# Patient Record
Sex: Female | Born: 1960 | Race: White | Hispanic: No | Marital: Married | Smoking: Current every day smoker
Health system: Southern US, Community
[De-identification: ages and names within clinical notes are randomized; demographics above are authoritative.]

## PROBLEM LIST (undated history)

## (undated) DIAGNOSIS — J449 Chronic obstructive pulmonary disease, unspecified: Secondary | ICD-10-CM

## (undated) DIAGNOSIS — E079 Disorder of thyroid, unspecified: Secondary | ICD-10-CM

## (undated) DIAGNOSIS — I10 Essential (primary) hypertension: Secondary | ICD-10-CM

## (undated) DIAGNOSIS — R000 Tachycardia, unspecified: Secondary | ICD-10-CM

## (undated) HISTORY — DX: Chronic obstructive pulmonary disease, unspecified: J44.9

## (undated) HISTORY — DX: Disorder of thyroid, unspecified: E07.9

## (undated) HISTORY — DX: Tachycardia, unspecified: R00.0

---

## 2014-10-05 HISTORY — PX: LOBECTOMY: SHX5089

## 2019-05-18 ENCOUNTER — Ambulatory Visit: Payer: BLUE CROSS/BLUE SHIELD | Attending: Internal Medicine

## 2019-05-18 DIAGNOSIS — Z20822 Contact with and (suspected) exposure to covid-19: Secondary | ICD-10-CM

## 2019-05-20 LAB — NOVEL CORONAVIRUS, NAA: SARS-CoV-2, NAA: NOT DETECTED

## 2019-05-21 ENCOUNTER — Ambulatory Visit: Payer: BLUE CROSS/BLUE SHIELD | Attending: Internal Medicine

## 2019-05-21 DIAGNOSIS — Z20822 Contact with and (suspected) exposure to covid-19: Secondary | ICD-10-CM

## 2019-05-22 LAB — NOVEL CORONAVIRUS, NAA: SARS-CoV-2, NAA: NOT DETECTED

## 2019-09-30 ENCOUNTER — Other Ambulatory Visit: Payer: Self-pay | Admitting: Pulmonary Disease

## 2019-09-30 DIAGNOSIS — C342 Malignant neoplasm of middle lobe, bronchus or lung: Secondary | ICD-10-CM

## 2019-09-30 DIAGNOSIS — C3481 Malignant neoplasm of overlapping sites of right bronchus and lung: Secondary | ICD-10-CM

## 2019-10-12 ENCOUNTER — Ambulatory Visit
Admission: RE | Admit: 2019-10-12 | Discharge: 2019-10-12 | Disposition: A | Discharge status: Home / Self Care | Payer: BLUE CROSS/BLUE SHIELD | Source: Ambulatory Visit | Attending: Pulmonary Disease | Admitting: Pulmonary Disease

## 2019-10-12 ENCOUNTER — Other Ambulatory Visit: Payer: Self-pay

## 2019-10-12 DIAGNOSIS — C3481 Malignant neoplasm of overlapping sites of right bronchus and lung: Secondary | ICD-10-CM | POA: Insufficient documentation

## 2019-10-12 DIAGNOSIS — C342 Malignant neoplasm of middle lobe, bronchus or lung: Secondary | ICD-10-CM | POA: Insufficient documentation

## 2019-10-12 HISTORY — DX: Essential (primary) hypertension: I10

## 2019-10-12 LAB — POCT I-STAT CREATININE: Creatinine, Ser: 0.5 mg/dL (ref 0.44–1.00)

## 2019-10-12 MED ORDER — IOHEXOL 300 MG/ML  SOLN
75.0000 mL | Freq: Once | INTRAMUSCULAR | Status: AC | PRN
  Administered 2019-10-12: 75 mL via INTRAVENOUS

## 2020-03-09 ENCOUNTER — Other Ambulatory Visit: Payer: Self-pay | Admitting: Internal Medicine

## 2020-03-09 DIAGNOSIS — Z1231 Encounter for screening mammogram for malignant neoplasm of breast: Secondary | ICD-10-CM

## 2020-08-04 ENCOUNTER — Inpatient Hospital Stay: Payer: BLUE CROSS/BLUE SHIELD

## 2020-08-04 ENCOUNTER — Inpatient Hospital Stay: Payer: BLUE CROSS/BLUE SHIELD | Attending: Oncology | Admitting: Oncology

## 2020-08-04 ENCOUNTER — Encounter: Payer: Self-pay | Admitting: Oncology

## 2020-08-04 VITALS — BP 124/84 | HR 84 | Temp 99.7°F | Resp 18 | Ht 59.0 in | Wt 124.7 lb

## 2020-08-04 DIAGNOSIS — D751 Secondary polycythemia: Secondary | ICD-10-CM

## 2020-08-04 DIAGNOSIS — D72829 Elevated white blood cell count, unspecified: Secondary | ICD-10-CM | POA: Diagnosis not present

## 2020-08-04 DIAGNOSIS — F17210 Nicotine dependence, cigarettes, uncomplicated: Secondary | ICD-10-CM | POA: Diagnosis not present

## 2020-08-04 DIAGNOSIS — Z800 Family history of malignant neoplasm of digestive organs: Secondary | ICD-10-CM | POA: Insufficient documentation

## 2020-08-04 DIAGNOSIS — Z807 Family history of other malignant neoplasms of lymphoid, hematopoietic and related tissues: Secondary | ICD-10-CM | POA: Diagnosis not present

## 2020-08-04 DIAGNOSIS — Z902 Acquired absence of lung [part of]: Secondary | ICD-10-CM | POA: Diagnosis not present

## 2020-08-04 DIAGNOSIS — J439 Emphysema, unspecified: Secondary | ICD-10-CM | POA: Diagnosis not present

## 2020-08-04 DIAGNOSIS — Z85118 Personal history of other malignant neoplasm of bronchus and lung: Secondary | ICD-10-CM | POA: Diagnosis not present

## 2020-08-04 LAB — COMPREHENSIVE METABOLIC PANEL
ALT: 27 U/L (ref 0–44)
AST: 28 U/L (ref 15–41)
Albumin: 4.2 g/dL (ref 3.5–5.0)
Alkaline Phosphatase: 68 U/L (ref 38–126)
Anion gap: 10 (ref 5–15)
BUN: 13 mg/dL (ref 6–20)
CO2: 27 mmol/L (ref 22–32)
Calcium: 9.4 mg/dL (ref 8.9–10.3)
Chloride: 102 mmol/L (ref 98–111)
Creatinine, Ser: 0.48 mg/dL (ref 0.44–1.00)
GFR, Estimated: >60 mL/min (ref >60)
Glucose, Bld: 99 mg/dL (ref 70–99)
Potassium: 3.8 mmol/L (ref 3.5–5.1)
Sodium: 139 mmol/L (ref 135–145)
Total Bilirubin: 0.6 mg/dL (ref 0.3–1.2)
Total Protein: 7.4 g/dL (ref 6.5–8.1)

## 2020-08-04 LAB — CBC WITH DIFFERENTIAL/PLATELET
Abs Immature Granulocytes: 0.04 10*3/uL (ref 0.00–0.07)
Basophils Absolute: 0.1 10*3/uL (ref 0.0–0.1)
Basophils Relative: 1 %
Eosinophils Absolute: 0.2 10*3/uL (ref 0.0–0.5)
Eosinophils Relative: 2 %
HCT: 43.7 % (ref 36.0–46.0)
Hemoglobin: 14.9 g/dL (ref 12.0–15.0)
Immature Granulocytes: 0 %
Lymphocytes Relative: 25 %
Lymphs Abs: 2.8 10*3/uL (ref 0.7–4.0)
MCH: 29.3 pg (ref 26.0–34.0)
MCHC: 34.1 g/dL (ref 30.0–36.0)
MCV: 85.9 fL (ref 80.0–100.0)
Monocytes Absolute: 1.1 10*3/uL — ABNORMAL HIGH (ref 0.1–1.0)
Monocytes Relative: 10 %
Neutro Abs: 6.9 10*3/uL (ref 1.7–7.7)
Neutrophils Relative %: 62 %
Platelets: 281 10*3/uL (ref 150–400)
RBC: 5.09 MIL/uL (ref 3.87–5.11)
RDW: 12.5 % (ref 11.5–15.5)
WBC: 11 10*3/uL — ABNORMAL HIGH (ref 4.0–10.5)
nRBC: 0 % (ref 0.0–0.2)

## 2020-08-04 LAB — LACTATE DEHYDROGENASE: LDH: 120 U/L (ref 98–192)

## 2020-08-04 NOTE — Progress Notes (Signed)
Patient here to establish care  

## 2020-08-04 NOTE — Progress Notes (Signed)
Hematology/Oncology Consult note [facility] Telephone:([phone] Fax:(336) [phone]   Patient Care Team: Gladstone Lighter, MD as PCP - General (Internal Medicine)  REFERRING PROVIDER: Ottie Glazier, MD  CHIEF COMPLAINTS/REASON FOR VISIT:  Evaluation of history of lung cancer  HISTORY OF PRESENTING ILLNESS:   Jamie Harmon is a  60 y.o.  female with PMH listed below was seen in consultation at the request of  Ottie Glazier, MD  for evaluation of history of lung cancer. Patient moved from [place] to [place] to stay closer to her son and grandkids living alone. Patient is current everyday smoker, 10 cigarettes/day.  She used to smoke 2 packs a day for at least 40 years. She had a history of Lung cancer status post right upper lobectomy.  She denies receiving any chemotherapy or radiation for the lung cancer. She has chronic cough with clear phlegm. She has been having surveillance CT done annually which was interrupted due to pandemic.  Family history she reports brother was diagnosed with esophageal cancer, another brother with GI cancer, mother with a history of gastric cancer.  Father has lymphoma.  Patient had surveillance CT scan done on 10/12/2019, CT showed status post right upper lobectomy-2016 no evidence of recurrent or metastatic disease in the chest. Patient has innumerable tiny centrilobular pulmonary nodules, nonspecific likely due to smoking-related respiratory bronchiolitis.  Emphysema.  Coronary artery disease.  Aortic atherosclerosis.  Hepatic steatosis.  Patient reports that recently she has had experienced more shortness of breath.  Denies any unintentional weight loss, fever, night sweats.Denies any hemoptysis.  Review of Systems  Constitutional: Negative for appetite change, chills, fatigue and fever.  HENT:   Negative for hearing loss and voice change.   Eyes: Negative for eye problems.  Respiratory: Positive for  cough and shortness of breath. Negative for chest tightness.   Cardiovascular: Negative for chest pain.  Gastrointestinal: Negative for abdominal distention, abdominal pain and blood in stool.  Endocrine: Negative for hot flashes.  Genitourinary: Negative for difficulty urinating and frequency.   Musculoskeletal: Negative for arthralgias.  Skin: Negative for itching and rash.  Neurological: Negative for extremity weakness.  Hematological: Negative for adenopathy.  Psychiatric/Behavioral: Negative for confusion.    MEDICAL HISTORY:  Past Medical History:  Diagnosis Date  . COPD (chronic obstructive pulmonary disease) ([place])   . Hypertension   . Tachycardia   . Thyroid disease     SURGICAL HISTORY: Past Surgical History:  Procedure Laterality Date  . LOBECTOMY  10/2014    SOCIAL HISTORY: Social History   Socioeconomic History  . Marital status: Married    Spouse name: Not on file  . Number of children: Not on file  . Years of education: Not on file  . Highest education level: Not on file  Occupational History  . Occupation: not working   Tobacco Use  . Smoking status: Current Every Day Smoker    Packs/day: 0.50    Years: 45.00    Pack years: 22.50    Types: Cigarettes  . Smokeless tobacco: Never Used  Vaping Use  . Vaping Use: Never used  Substance and Sexual Activity  . Alcohol use: Yes    Comment: occasional wine   . Drug use: Never  . Sexual activity: Not on file  Other Topics Concern  . Not on file  Social History Narrative  . Not on file   Social Determinants of Health   Financial Resource Strain: Not on file  Food Insecurity: Not on file  Transportation Needs: Not on file  Physical Activity: Not on file  Stress: Not on file  Social Connections: Not on file  Intimate Partner Violence: Not on file    FAMILY HISTORY: Family History  Problem Relation Age of Onset  . Stomach cancer Mother   . Hypertension Father   . Lymphoma Father   . Diabetes  Sister   . Blindness Sister   . Hypertension Sister   . Colon cancer Brother   . Esophageal cancer Brother   . Hypertension Brother   . Diabetes Brother     ALLERGIES:  has No Known Allergies.  MEDICATIONS:  Current Outpatient Medications  Medication Sig Dispense Refill  . aspirin 81 MG chewable tablet Chew 81 mg by mouth daily.    Marland Kitchen BREZTRI AEROSPHERE 160-9-4.8 MCG/ACT AERO Inhale 2 puffs into the lungs 2 (two) times daily.    . cetirizine (ZYRTEC) 10 MG tablet Take 10 mg by mouth at bedtime.    . chlorthalidone (HYGROTON) 25 MG tablet Take 25 mg by mouth daily.    . fluticasone (FLONASE) 50 MCG/ACT nasal spray Place 1 spray into both nostrils 2 (two) times daily as needed.    Marland Kitchen levothyroxine (SYNTHROID) 200 MCG tablet Take 200 mcg by mouth daily.    . potassium chloride (KLOR-CON) 10 MEQ tablet Take 1 tablet by mouth daily.    . propranolol (INDERAL) 40 MG tablet Take 40 mg by mouth at bedtime.    . rosuvastatin (CRESTOR) 40 MG tablet Take 40 mg by mouth daily.     No current facility-administered medications for this visit.     PHYSICAL EXAMINATION: ECOG PERFORMANCE STATUS: 1 - Symptomatic but completely ambulatory Vitals:   08/04/20 1106  BP: 124/84  Pulse: 84  Resp: 18  Temp: 99.7 F (37.6 C)  SpO2: (!) 6%   Filed Weights   08/04/20 1106  Weight: 124 lb 11.2 oz (56.6 kg)    Physical Exam Constitutional:      General: She is not in acute distress. HENT:     Head: Normocephalic and atraumatic.  Eyes:     General: No scleral icterus. Cardiovascular:     Rate and Rhythm: Normal rate and regular rhythm.     Heart sounds: Normal heart sounds.  Pulmonary:     Effort: Pulmonary effort is normal. No respiratory distress.     Breath sounds: No wheezing.     Comments: Decreased breath sounds bilaterally Abdominal:     General: Bowel sounds are normal. There is no distension.     Palpations: Abdomen is soft.  Musculoskeletal:        General: No deformity. Normal  range of motion.     Cervical back: Normal range of motion and neck supple.  Skin:    General: Skin is warm and dry.     Findings: No erythema or rash.  Neurological:     Mental Status: She is alert and oriented to person, place, and time. Mental status is at baseline.     Cranial Nerves: No cranial nerve deficit.     Coordination: Coordination normal.  Psychiatric:        Mood and Affect: Mood normal.     LABORATORY DATA:  I have reviewed the data as listed Lab Results  Component Value Date   WBC 11.0 (H) 08/04/2020   HGB 14.9 08/04/2020   HCT 43.7 08/04/2020   MCV 85.9 08/04/2020   PLT 281 08/04/2020   Recent Labs    10/12/19 0900  08/04/20 1139  NA  --  139  K  --  3.8  CL  --  102  CO2  --  27  GLUCOSE  --  99  BUN  --  13  CREATININE 0.50 0.48  CALCIUM  --  9.4  GFRNONAA  --  >60  PROT  --  7.4  ALBUMIN  --  4.2  AST  --  28  ALT  --  27  ALKPHOS  --  68  BILITOT  --  0.6   Iron/TIBC/Ferritin/ %Sat No results found for: IRON, TIBC, FERRITIN, IRONPCTSAT    RADIOGRAPHIC STUDIES: I have personally reviewed the radiological images as listed and agreed with the findings in the report. No results found.    ASSESSMENT & PLAN:  1. History of lung cancer   2. Leukocytosis, unspecified type    #History of lung cancer status post right upper lobectomy. Records were not available to me at this point.  Her history indicates likely early stage lung cancer. Patient has been 5 years out of her lung cancer diagnosis/treatment.  #Tobacco abuse, smoking cessation was discussed.  Given her significant smoking history, history of lung cancer, I recommend annual surveillance CT scan.  Will obtain in June 2022. #COPD/emphysema.  Follow-up with pulmonology.  # leukocytosis check CBC CMP today.  Likely due to smoking.  Monitor.  Orders Placed This Encounter  Procedures  . CT Chest W Contrast    Standing Status:   Future    Standing Expiration Date:   08/04/2021     Order Specific Question:   If indicated for the ordered procedure, I authorize the administration of contrast media per Radiology protocol    Answer:   Yes    Order Specific Question:   Is patient pregnant?    Answer:   No    Order Specific Question:   Preferred imaging location?    Answer:   [facility]  . CBC with Differential/Platelet    Standing Status:   Future    Number of Occurrences:   1    Standing Expiration Date:   08/04/2021  . Comprehensive metabolic panel    Standing Status:   Future    Number of Occurrences:   1    Standing Expiration Date:   08/04/2021  . Carbon monoxide, blood (performed at ref lab)    Standing Status:   Future    Number of Occurrences:   1    Standing Expiration Date:   08/04/2021  . Lactate dehydrogenase    Standing Status:   Future    Number of Occurrences:   1    Standing Expiration Date:   08/04/2021    All questions were answered. The patient knows to call the clinic with any problems questions or concerns.  cc Ottie Glazier, MD    Return of visit: After June 2022 CT scan. Thank you for this kind referral and the opportunity to participate in the care of this patient. A copy of today's note is routed to referring provider    Earlie Server, MD, PhD Hematology Oncology [facility] Pager- [phone] 08/04/2020

## 2020-08-07 LAB — CARBON MONOXIDE, BLOOD (PERFORMED AT REF LAB): Carbon Monoxide, Blood: 6.6 % — ABNORMAL HIGH (ref 0.0–3.6)

## 2020-10-12 ENCOUNTER — Other Ambulatory Visit: Payer: Self-pay

## 2020-10-12 ENCOUNTER — Ambulatory Visit
Admission: RE | Admit: 2020-10-12 | Discharge: 2020-10-12 | Disposition: A | Discharge status: Home / Self Care | Payer: BLUE CROSS/BLUE SHIELD | Source: Ambulatory Visit | Attending: Oncology | Admitting: Oncology

## 2020-10-12 DIAGNOSIS — Z85118 Personal history of other malignant neoplasm of bronchus and lung: Secondary | ICD-10-CM

## 2020-10-12 LAB — POCT I-STAT CREATININE: Creatinine, Ser: 0.5 mg/dL (ref 0.44–1.00)

## 2020-10-12 MED ORDER — IOHEXOL 300 MG/ML  SOLN
75.0000 mL | Freq: Once | INTRAMUSCULAR | Status: AC | PRN
  Administered 2020-10-12: 75 mL via INTRAVENOUS

## 2020-10-17 ENCOUNTER — Inpatient Hospital Stay: Payer: BLUE CROSS/BLUE SHIELD | Attending: Oncology | Admitting: Oncology

## 2020-10-17 ENCOUNTER — Other Ambulatory Visit: Payer: Self-pay

## 2020-10-17 ENCOUNTER — Encounter: Payer: Self-pay | Admitting: Oncology

## 2020-10-17 VITALS — BP 109/79 | HR 78 | Temp 97.9°F | Resp 16 | Wt 124.5 lb

## 2020-10-17 DIAGNOSIS — Z7982 Long term (current) use of aspirin: Secondary | ICD-10-CM | POA: Insufficient documentation

## 2020-10-17 DIAGNOSIS — E079 Disorder of thyroid, unspecified: Secondary | ICD-10-CM | POA: Diagnosis not present

## 2020-10-17 DIAGNOSIS — D72829 Elevated white blood cell count, unspecified: Secondary | ICD-10-CM | POA: Insufficient documentation

## 2020-10-17 DIAGNOSIS — Z720 Tobacco use: Secondary | ICD-10-CM | POA: Diagnosis not present

## 2020-10-17 DIAGNOSIS — Z902 Acquired absence of lung [part of]: Secondary | ICD-10-CM | POA: Diagnosis not present

## 2020-10-17 DIAGNOSIS — Z79899 Other long term (current) drug therapy: Secondary | ICD-10-CM | POA: Diagnosis not present

## 2020-10-17 DIAGNOSIS — Z85118 Personal history of other malignant neoplasm of bronchus and lung: Secondary | ICD-10-CM

## 2020-10-17 DIAGNOSIS — I10 Essential (primary) hypertension: Secondary | ICD-10-CM | POA: Insufficient documentation

## 2020-10-17 DIAGNOSIS — F17210 Nicotine dependence, cigarettes, uncomplicated: Secondary | ICD-10-CM | POA: Diagnosis not present

## 2020-10-17 NOTE — Progress Notes (Signed)
Hematology/Oncology Consult note [facility] Telephone:([phone] Fax:(336) [phone]   Patient Care Team: Gladstone Lighter, MD as PCP - General (Internal Medicine)  REFERRING PROVIDER: Gladstone Lighter, MD  CHIEF COMPLAINTS/REASON FOR VISIT:  Evaluation of history of lung cancer  HISTORY OF PRESENTING ILLNESS:   Jamie Harmon is a  60 y.o.  female with PMH listed below was seen in consultation at the request of  Gladstone Lighter, MD  for evaluation of history of lung cancer. Patient moved from [place] to [place] to stay closer to her son and grandkids living alone. Patient is current everyday smoker, 10 cigarettes/day.  She used to smoke 2 packs a day for at least 40 years. She had a history of Lung cancer status post right upper lobectomy.  She denies receiving any chemotherapy or radiation for the lung cancer. She has chronic cough with clear phlegm. She has been having surveillance CT done annually which was interrupted due to pandemic.  Family history she reports brother was diagnosed with esophageal cancer, another brother with GI cancer, mother with a history of gastric cancer.  Father has lymphoma.  Patient had surveillance CT scan done on 10/12/2019, CT showed status post right upper lobectomy-2016 no evidence of recurrent or metastatic disease in the chest. Patient has innumerable tiny centrilobular pulmonary nodules, nonspecific likely due to smoking-related respiratory bronchiolitis.  Emphysema.  Coronary artery disease.  Aortic atherosclerosis.  Hepatic steatosis.  Patient reports that recently she has had experienced more shortness of breath.  Denies any unintentional weight loss, fever, night sweats.Denies any hemoptysis.   INTERVAL HISTORY Jamie Harmon is a 60 y.o. female who has above history reviewed by me today presents for follow up visit for management of history of lung cancer Problems and complaints  are listed below: No new complaints.  She had a CT done since last visit.  Review of Systems  Constitutional:  Negative for appetite change, chills, fatigue and fever.  HENT:   Negative for hearing loss and voice change.   Eyes:  Negative for eye problems.  Respiratory:  Positive for cough and shortness of breath. Negative for chest tightness.   Cardiovascular:  Negative for chest pain.  Gastrointestinal:  Negative for abdominal distention, abdominal pain and blood in stool.  Endocrine: Negative for hot flashes.  Genitourinary:  Negative for difficulty urinating and frequency.   Musculoskeletal:  Negative for arthralgias.  Skin:  Negative for itching and rash.  Neurological:  Negative for extremity weakness.  Hematological:  Negative for adenopathy.  Psychiatric/Behavioral:  Negative for confusion.    MEDICAL HISTORY:  Past Medical History:  Diagnosis Date   COPD (chronic obstructive pulmonary disease) ([place])    Hypertension    Tachycardia    Thyroid disease     SURGICAL HISTORY: Past Surgical History:  Procedure Laterality Date   LOBECTOMY  10/2014    SOCIAL HISTORY: Social History   Socioeconomic History   Marital status: Married    Spouse name: Not on file   Number of children: Not on file   Years of education: Not on file   Highest education level: Not on file  Occupational History   Occupation: not working   Tobacco Use   Smoking status: Every Day    Packs/day: 0.50    Years: 45.00    Pack years: 22.50    Types: Cigarettes   Smokeless tobacco: Never  Vaping Use   Vaping Use: Never used  Substance and Sexual Activity   Alcohol use:  Yes    Comment: occasional wine    Drug use: Never   Sexual activity: Not on file  Other Topics Concern   Not on file  Social History Narrative   Not on file   Social Determinants of Health   Financial Resource Strain: Not on file  Food Insecurity: Not on file  Transportation Needs: Not on file  Physical Activity: Not  on file  Stress: Not on file  Social Connections: Not on file  Intimate Partner Violence: Not on file    FAMILY HISTORY: Family History  Problem Relation Age of Onset   Stomach cancer Mother    Hypertension Father    Lymphoma Father    Diabetes Sister    Blindness Sister    Hypertension Sister    Colon cancer Brother    Esophageal cancer Brother    Hypertension Brother    Diabetes Brother     ALLERGIES:  has No Known Allergies.  MEDICATIONS:  Current Outpatient Medications  Medication Sig Dispense Refill   aspirin 81 MG chewable tablet Chew 81 mg by mouth daily.     BREZTRI AEROSPHERE 160-9-4.8 MCG/ACT AERO Inhale 2 puffs into the lungs 2 (two) times daily.     cetirizine (ZYRTEC) 10 MG tablet Take 10 mg by mouth at bedtime.     chlorthalidone (HYGROTON) 25 MG tablet Take 25 mg by mouth daily.     fluticasone (FLONASE) 50 MCG/ACT nasal spray Place 1 spray into both nostrils 2 (two) times daily as needed.     levothyroxine (SYNTHROID) 200 MCG tablet Take 200 mcg by mouth daily.     potassium chloride (KLOR-CON) 10 MEQ tablet Take 1 tablet by mouth daily.     propranolol (INDERAL) 40 MG tablet Take 40 mg by mouth at bedtime.     rosuvastatin (CRESTOR) 40 MG tablet Take 40 mg by mouth daily.     No current facility-administered medications for this visit.     PHYSICAL EXAMINATION: ECOG PERFORMANCE STATUS: 1 - Symptomatic but completely ambulatory Vitals:   10/17/20 1002  BP: 109/79  Pulse: 78  Resp: 16  Temp: 97.9 F (36.6 C)   Filed Weights   10/17/20 1002  Weight: 124 lb 8 oz (56.5 kg)    Physical Exam Constitutional:      General: She is not in acute distress. HENT:     Head: Normocephalic and atraumatic.  Eyes:     General: No scleral icterus. Cardiovascular:     Rate and Rhythm: Normal rate and regular rhythm.     Heart sounds: Normal heart sounds.  Pulmonary:     Effort: Pulmonary effort is normal. No respiratory distress.     Breath sounds: No  wheezing.     Comments: Decreased breath sounds bilaterally Abdominal:     General: Bowel sounds are normal. There is no distension.     Palpations: Abdomen is soft.  Musculoskeletal:        General: No deformity. Normal range of motion.     Cervical back: Normal range of motion and neck supple.  Skin:    General: Skin is warm and dry.     Findings: No erythema or rash.  Neurological:     Mental Status: She is alert and oriented to person, place, and time. Mental status is at baseline.     Cranial Nerves: No cranial nerve deficit.     Coordination: Coordination normal.  Psychiatric:        Mood and Affect: Mood normal.  LABORATORY DATA:  I have reviewed the data as listed Lab Results  Component Value Date   WBC 11.0 (H) 08/04/2020   HGB 14.9 08/04/2020   HCT 43.7 08/04/2020   MCV 85.9 08/04/2020   PLT 281 08/04/2020   Recent Labs    08/04/20 1139 10/12/20 1048  NA 139  --   K 3.8  --   CL 102  --   CO2 27  --   GLUCOSE 99  --   BUN 13  --   CREATININE 0.48 0.50  CALCIUM 9.4  --   GFRNONAA >60  --   PROT 7.4  --   ALBUMIN 4.2  --   AST 28  --   ALT 27  --   ALKPHOS 68  --   BILITOT 0.6  --     Iron/TIBC/Ferritin/ %Sat No results found for: IRON, TIBC, FERRITIN, IRONPCTSAT    RADIOGRAPHIC STUDIES: I have personally reviewed the radiological images as listed and agreed with the findings in the report. CT Chest W Contrast  Result Date: 10/13/2020 CLINICAL DATA:  History of lung cancer status post right upper lobectomy in 2016. Chronic shortness of breath. Surveillance. EXAM: CT CHEST WITH CONTRAST TECHNIQUE: Multidetector CT imaging of the chest was performed during intravenous contrast administration. CONTRAST:  83mL OMNIPAQUE IOHEXOL 300 MG/ML  SOLN COMPARISON:  CT chest 10/12/2019. FINDINGS: Cardiovascular: No acute vascular findings. There is atherosclerosis of the aorta, great vessels and coronary arteries. There is dense calcification at the origin of the  right subclavian artery which may indicate significant stenosis. The heart size is normal. There is no pericardial effusion. Mediastinum/Nodes: There are no enlarged mediastinal, hilar or axillary lymph nodes.Small mediastinal lymph nodes are unchanged. The thyroid gland appears small without focal abnormality. The trachea and esophagus appear unremarkable. Lungs/Pleura: There is no pleural effusion or pneumothorax. Status post right upper lobectomy. Mild centrilobular emphysema with linear right basilar scarring. No suspicious pulmonary nodularity. Previously questioned findings of smoking related respiratory bronchiolitis appear improved. Upper abdomen: The visualized upper abdomen appears stable without significant findings. Musculoskeletal/Chest wall: There is no chest wall mass or suspicious osseous finding. Mild thoracic spondylosis. IMPRESSION: 1. Stable postoperative chest CT status post right upper lobe resection. No evidence of local recurrence or metastatic disease. 2. Dense calcification at the origin of the right subclavian artery may indicate significant stenosis. Correlate clinically. 3. Aortic Atherosclerosis (ICD10-I70.0) and Emphysema (ICD10-J43.9). Electronically Signed   By: Richardean Sale M.D.   On: 10/13/2020 09:07      ASSESSMENT & PLAN:  1. History of lung cancer   2. Leukocytosis, unspecified type   3. Tobacco use    #History of lung cancer status post right upper lobectomy. Records were not available to me at this point.  Her history indicates likely early stage lung cancer. Chronic cough likely secondary to tobacco use.  Smoking cessation was discussed. 10/12/2020, surveillance CT scan showed stable postoperative chest CT findings.  No evidence of local recurrence or metastatic disease.  #COPD/emphysema.  Follow-up with pulmonology. #Dense calcification at the origin of right subclavian artery which may indicate significant stenosis.  Recommend patient to further discuss with  primary care doctor and cardiology.  Continue aspirin and statin. # leukocytosis check CBC CMP today.  Likely due to smoking.  Recommend observation.  Orders Placed This Encounter  Procedures   CT Chest W Contrast    Standing Status:   Future    Standing Expiration Date:   10/17/2021  Order Specific Question:   If indicated for the ordered procedure, I authorize the administration of contrast media per Radiology protocol    Answer:   Yes    Order Specific Question:   Is patient pregnant?    Answer:   No    Order Specific Question:   Preferred imaging location?    Answer:   [facility]   CBC with Differential/Platelet    Standing Status:   Future    Standing Expiration Date:   10/17/2021   Comprehensive metabolic panel    Standing Status:   Future    Standing Expiration Date:   10/17/2021   Lactate dehydrogenase    Standing Status:   Future    Standing Expiration Date:   10/17/2021    All questions were answered. The patient knows to call the clinic with any problems questions or concerns.  cc Gladstone Lighter, MD    Return of visit:Lab MD  CBC CMP After June 2023 CT scan. Thank you for this kind referral and the opportunity to participate in the care of this patient. A copy of today's note is routed to referring provider    Earlie Server, MD, PhD Hematology Oncology [facility] Pager- [phone] 10/17/2020

## 2020-10-17 NOTE — Progress Notes (Signed)
Patient denies new problems/concerns today.   °

## 2021-03-09 ENCOUNTER — Other Ambulatory Visit: Payer: Self-pay | Admitting: Internal Medicine

## 2021-03-09 DIAGNOSIS — Z1231 Encounter for screening mammogram for malignant neoplasm of breast: Secondary | ICD-10-CM

## 2021-04-12 ENCOUNTER — Ambulatory Visit
Admission: RE | Admit: 2021-04-12 | Discharge: 2021-04-12 | Disposition: A | Discharge status: Home / Self Care | Payer: BLUE CROSS/BLUE SHIELD | Source: Ambulatory Visit | Attending: Internal Medicine | Admitting: Internal Medicine

## 2021-04-12 ENCOUNTER — Other Ambulatory Visit: Payer: Self-pay

## 2021-04-12 DIAGNOSIS — Z1231 Encounter for screening mammogram for malignant neoplasm of breast: Secondary | ICD-10-CM | POA: Insufficient documentation

## 2021-06-07 ENCOUNTER — Encounter: Payer: Self-pay | Admitting: Cardiology

## 2021-06-07 ENCOUNTER — Ambulatory Visit
Admission: RE | Admit: 2021-06-07 | Discharge: 2021-06-07 | Disposition: A | Discharge status: Home / Self Care | Payer: BLUE CROSS/BLUE SHIELD | Attending: Cardiology | Admitting: Cardiology

## 2021-06-07 ENCOUNTER — Encounter
Admission: RE | Disposition: A | Discharge status: Home / Self Care | Payer: Self-pay | Source: Home / Self Care | Attending: Cardiology

## 2021-06-07 ENCOUNTER — Other Ambulatory Visit: Payer: Self-pay

## 2021-06-07 DIAGNOSIS — I4891 Unspecified atrial fibrillation: Secondary | ICD-10-CM | POA: Insufficient documentation

## 2021-06-07 DIAGNOSIS — I10 Essential (primary) hypertension: Secondary | ICD-10-CM | POA: Insufficient documentation

## 2021-06-07 DIAGNOSIS — I2089 Other forms of angina pectoris: Secondary | ICD-10-CM

## 2021-06-07 DIAGNOSIS — I739 Peripheral vascular disease, unspecified: Secondary | ICD-10-CM | POA: Insufficient documentation

## 2021-06-07 DIAGNOSIS — F17210 Nicotine dependence, cigarettes, uncomplicated: Secondary | ICD-10-CM | POA: Diagnosis not present

## 2021-06-07 DIAGNOSIS — E079 Disorder of thyroid, unspecified: Secondary | ICD-10-CM | POA: Diagnosis not present

## 2021-06-07 DIAGNOSIS — R9430 Abnormal result of cardiovascular function study, unspecified: Secondary | ICD-10-CM

## 2021-06-07 DIAGNOSIS — I25118 Atherosclerotic heart disease of native coronary artery with other forms of angina pectoris: Secondary | ICD-10-CM | POA: Diagnosis present

## 2021-06-07 DIAGNOSIS — G4733 Obstructive sleep apnea (adult) (pediatric): Secondary | ICD-10-CM | POA: Insufficient documentation

## 2021-06-07 DIAGNOSIS — I208 Other forms of angina pectoris: Secondary | ICD-10-CM

## 2021-06-07 HISTORY — PX: LEFT HEART CATH AND CORONARY ANGIOGRAPHY: CATH118249

## 2021-06-07 SURGERY — LEFT HEART CATH AND CORONARY ANGIOGRAPHY
Anesthesia: Moderate Sedation

## 2021-06-07 MED ORDER — SODIUM CHLORIDE 0.9 % IV SOLN: 250.0000 mL | INTRAVENOUS | Status: DC | PRN

## 2021-06-07 MED ORDER — IOHEXOL 300 MG/ML  SOLN
INTRAMUSCULAR | Status: DC | PRN
  Administered 2021-06-07: 39 mL

## 2021-06-07 MED ORDER — ONDANSETRON HCL 4 MG/2ML IJ SOLN: 4.0000 mg | Freq: Four times a day (QID) | INTRAMUSCULAR | Status: DC | PRN

## 2021-06-07 MED ORDER — SODIUM CHLORIDE 0.9 % IV BOLUS
500.0000 mL | Freq: Once | INTRAVENOUS | Status: AC
  Administered 2021-06-07: 500 mL via INTRAVENOUS

## 2021-06-07 MED ORDER — SODIUM CHLORIDE 0.9 % IV SOLN: INTRAVENOUS | Status: DC

## 2021-06-07 MED ORDER — MIDAZOLAM HCL 2 MG/2ML IJ SOLN
INTRAMUSCULAR | Status: DC | PRN
  Administered 2021-06-07: 1 mg via INTRAVENOUS

## 2021-06-07 MED ORDER — SODIUM CHLORIDE 0.9% FLUSH: 3.0000 mL | INTRAVENOUS | Status: DC | PRN

## 2021-06-07 MED ORDER — FENTANYL CITRATE (PF) 100 MCG/2ML IJ SOLN
INTRAMUSCULAR | Status: AC
  Filled 2021-06-07: qty 2

## 2021-06-07 MED ORDER — HEPARIN (PORCINE) IN NACL 1000-0.9 UT/500ML-% IV SOLN
INTRAVENOUS | Status: DC | PRN
  Administered 2021-06-07: 1000 mL

## 2021-06-07 MED ORDER — SODIUM CHLORIDE 0.9% FLUSH: 3.0000 mL | Freq: Two times a day (BID) | INTRAVENOUS | Status: DC

## 2021-06-07 MED ORDER — ACETAMINOPHEN 325 MG PO TABS: 650.0000 mg | ORAL_TABLET | ORAL | Status: DC | PRN

## 2021-06-07 MED ORDER — MIDAZOLAM HCL 2 MG/2ML IJ SOLN
INTRAMUSCULAR | Status: AC
  Filled 2021-06-07: qty 2

## 2021-06-07 MED ORDER — LIDOCAINE HCL 1 % IJ SOLN
INTRAMUSCULAR | Status: AC
  Filled 2021-06-07: qty 20

## 2021-06-07 MED ORDER — FENTANYL CITRATE (PF) 100 MCG/2ML IJ SOLN
INTRAMUSCULAR | Status: DC | PRN
  Administered 2021-06-07: 25 ug via INTRAVENOUS

## 2021-06-07 MED ORDER — ASPIRIN 81 MG PO CHEW
CHEWABLE_TABLET | ORAL | Status: AC
  Administered 2021-06-07: 243 mg via ORAL
  Filled 2021-06-07: qty 3

## 2021-06-07 MED ORDER — LIDOCAINE HCL (PF) 1 % IJ SOLN
INTRAMUSCULAR | Status: DC | PRN
  Administered 2021-06-07: 20 mL

## 2021-06-07 MED ORDER — ASPIRIN 81 MG PO CHEW: 324.0000 mg | CHEWABLE_TABLET | ORAL | Status: AC

## 2021-06-07 MED ORDER — HEPARIN (PORCINE) IN NACL 1000-0.9 UT/500ML-% IV SOLN
INTRAVENOUS | Status: AC
  Filled 2021-06-07: qty 1000

## 2021-06-07 MED ORDER — METOPROLOL SUCCINATE ER 25 MG PO TB24: 25.0000 mg | ORAL_TABLET | Freq: Every day | ORAL | 11 refills | Status: DC

## 2021-06-07 SURGICAL SUPPLY — 15 items
CATH INFINITI 5FR MULTPACK ANG (CATHETERS) ×1 IMPLANT
DRAPE BRACHIAL (DRAPES) ×1 IMPLANT
GLIDESHEATH SLEND SS 6F .021 (SHEATH) IMPLANT
GUIDEWIRE INQWIRE 1.5J.035X260 (WIRE) IMPLANT
INQWIRE 1.5J .035X260CM (WIRE)
KIT MICROPUNCTURE NIT STIFF (SHEATH) ×1 IMPLANT
NDL PERC 18GX7CM (NEEDLE) IMPLANT
NEEDLE PERC 18GX7CM (NEEDLE) IMPLANT
PACK CARDIAC CATH (CUSTOM PROCEDURE TRAY) ×2 IMPLANT
PAD ELECT DEFIB RADIOL ZOLL (MISCELLANEOUS) ×1 IMPLANT
PROTECTION STATION PRESSURIZED (MISCELLANEOUS) ×2
SET ATX SIMPLICITY (MISCELLANEOUS) ×1 IMPLANT
SHEATH AVANTI 5FR X 11CM (SHEATH) ×1 IMPLANT
STATION PROTECTION PRESSURIZED (MISCELLANEOUS) IMPLANT
WIRE GUIDERIGHT .035X150 (WIRE) ×1 IMPLANT

## 2021-06-07 NOTE — H&P (Signed)
[facility] Cardiology History and Physical  Patient ID: Jamie Harmon MRN: [id number] DOB/AGE: 06-08-1960 61 y.o. Admit date: 06/07/2021  Primary Care Physician: Gladstone Lighter, MD Primary Cardiologist Andrez Grime, MD  HPI:  Jamie Harmon is a 61 year old female with history of severe coronary calcifications on CT scan, obstructive sleep apnea, thyroid disease, hypertension, lung cancer, atrial fibrillation who presents for follow-up.  She was describing shortness of breath with exertion and chest tightness and subsequently underwent a stress test in December 2022 which showed inferior hypokinesis and 2 mm of ST depressions with frequent ectopy.  Given her extremely high pretest probability as she is referred for heart catheterization for further evaluation.    Past Medical History:  Diagnosis Date   COPD (chronic obstructive pulmonary disease) ([place])    Hypertension    Tachycardia    Thyroid disease     Past Surgical History:  Procedure Laterality Date   LOBECTOMY  10/2014    Medications Prior to Admission  Medication Sig Dispense Refill Last Dose   aspirin 81 MG chewable tablet Chew 81 mg by mouth daily.   06/07/2021   BREZTRI AEROSPHERE 160-9-4.8 MCG/ACT AERO Inhale 2 puffs into the lungs 2 (two) times daily as needed (SOB).   06/06/2021   chlorthalidone (HYGROTON) 25 MG tablet Take 25 mg by mouth daily.   06/07/2021   fluticasone (FLONASE) 50 MCG/ACT nasal spray Place 1 spray into both nostrils 2 (two) times daily as needed.   Past Month   levothyroxine (SYNTHROID) 137 MCG tablet Take 137 mcg by mouth daily before breakfast.   06/07/2021   potassium chloride (KLOR-CON) 10 MEQ tablet Take 10 mEq by mouth daily.   06/07/2021   propranolol (INDERAL) 40 MG tablet Take 40 mg by mouth at bedtime.   06/07/2021   rosuvastatin (CRESTOR) 40 MG tablet Take 40 mg by mouth daily.   06/07/2021   Social History   Socioeconomic History   Marital status: Married    Spouse name: Not on  file   Number of children: Not on file   Years of education: Not on file   Highest education level: Not on file  Occupational History   Occupation: not working   Tobacco Use   Smoking status: Every Day    Packs/day: 0.50    Years: 45.00    Pack years: 22.50    Types: Cigarettes   Smokeless tobacco: Never  Vaping Use   Vaping Use: Never used  Substance and Sexual Activity   Alcohol use: Yes    Comment: occasional wine    Drug use: Never   Sexual activity: Not on file  Other Topics Concern   Not on file  Social History Narrative   Not on file   Social Determinants of Health   Financial Resource Strain: Not on file  Food Insecurity: Not on file  Transportation Needs: Not on file  Physical Activity: Not on file  Stress: Not on file  Social Connections: Not on file  Intimate Partner Violence: Not on file    Family History  Problem Relation Age of Onset   Stomach cancer Mother    Hypertension Father    Lymphoma Father    Diabetes Sister    Blindness Sister    Hypertension Sister    Colon cancer Brother    Esophageal cancer Brother    Hypertension Brother    Diabetes Brother    Breast cancer Neg Hx       Review of systems complete and  found to be negative unless listed above    Physical Exam:  General: Well developed, well nourished, in no acute distress HEENT:  Normocephalic and atramatic Neck:  No JVD.  Lungs: Clear bilaterally to auscultation and percussion. Heart: HRRR . Normal S1 and S2 without gallops or murmurs.  Abdomen: Bowel sounds are positive, abdomen soft and non-tender  Msk:  Back normal, normal gait. Normal strength and tone for age. Extremities: No clubbing, cyanosis or edema.   Neuro: Alert and oriented X 3. Psych:  Good affect, responds appropriately   Labs:   Lab Results  Component Value Date   WBC 11.0 (H) 08/04/2020   HGB 14.9 08/04/2020   HCT 43.7 08/04/2020   MCV 85.9 08/04/2020   PLT 281 08/04/2020   No results for  input(s): NA, K, CL, CO2, BUN, CREATININE, CALCIUM, PROT, BILITOT, ALKPHOS, ALT, AST, GLUCOSE in the last 168 hours.  Invalid input(s): LABALBU No results found for: CKTOTAL, CKMB, CKMBINDEX, TROPONINI No results found for: CHOL No results found for: HDL No results found for: LDLCALC No results found for: TRIG No results found for: CHOLHDL No results found for: LDLDIRECT    Radiology: No results found.  EKG: Sinus rhythm with frequent PVCs.  ASSESSMENT AND PLAN:  61 year old female with multiple cardiac risk factors and severe coronary calcifications on CT scan who was been experiencing shortness of breath and occasional chest tightness.  She had a stress test that showed 2 mm of ST depressions inferiorly as well as inferior hypokinesis; she also had frequent ventricular ectopy.  We will proceed with heart catheterization for further evaluation.  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  We will attempt a right femoral artery access.  The risks and benefits were discussed with the patient who is agreeable to proceed.  Signed: Andrez Grime MD 06/07/2021, 9:44 AM

## 2021-10-17 ENCOUNTER — Ambulatory Visit
Admission: RE | Admit: 2021-10-17 | Discharge: 2021-10-17 | Disposition: A | Discharge status: Home / Self Care | Payer: BLUE CROSS/BLUE SHIELD | Source: Ambulatory Visit | Attending: Oncology | Admitting: Oncology

## 2021-10-17 DIAGNOSIS — Z85118 Personal history of other malignant neoplasm of bronchus and lung: Secondary | ICD-10-CM | POA: Diagnosis present

## 2021-10-17 LAB — POCT I-STAT CREATININE: Creatinine, Ser: 0.5 mg/dL (ref 0.44–1.00)

## 2021-10-17 MED ORDER — IOHEXOL 300 MG/ML  SOLN
75.0000 mL | Freq: Once | INTRAMUSCULAR | Status: AC | PRN
  Administered 2021-10-17: 75 mL via INTRAVENOUS

## 2021-10-19 ENCOUNTER — Other Ambulatory Visit: Payer: Self-pay

## 2021-10-19 DIAGNOSIS — D72829 Elevated white blood cell count, unspecified: Secondary | ICD-10-CM

## 2021-10-21 ENCOUNTER — Encounter: Payer: Self-pay | Admitting: Oncology

## 2021-10-21 DIAGNOSIS — Z85118 Personal history of other malignant neoplasm of bronchus and lung: Secondary | ICD-10-CM | POA: Insufficient documentation

## 2021-10-22 ENCOUNTER — Other Ambulatory Visit: Payer: Self-pay

## 2021-10-22 ENCOUNTER — Inpatient Hospital Stay: Payer: BLUE CROSS/BLUE SHIELD | Attending: Oncology

## 2021-10-22 DIAGNOSIS — D72829 Elevated white blood cell count, unspecified: Secondary | ICD-10-CM | POA: Insufficient documentation

## 2021-10-22 DIAGNOSIS — D751 Secondary polycythemia: Secondary | ICD-10-CM | POA: Diagnosis not present

## 2021-10-22 DIAGNOSIS — K760 Fatty (change of) liver, not elsewhere classified: Secondary | ICD-10-CM | POA: Insufficient documentation

## 2021-10-22 DIAGNOSIS — I2510 Atherosclerotic heart disease of native coronary artery without angina pectoris: Secondary | ICD-10-CM | POA: Diagnosis not present

## 2021-10-22 DIAGNOSIS — I10 Essential (primary) hypertension: Secondary | ICD-10-CM | POA: Insufficient documentation

## 2021-10-22 DIAGNOSIS — Z807 Family history of other malignant neoplasms of lymphoid, hematopoietic and related tissues: Secondary | ICD-10-CM | POA: Insufficient documentation

## 2021-10-22 DIAGNOSIS — I700 Atherosclerosis of aorta: Secondary | ICD-10-CM | POA: Diagnosis not present

## 2021-10-22 DIAGNOSIS — J439 Emphysema, unspecified: Secondary | ICD-10-CM | POA: Insufficient documentation

## 2021-10-22 DIAGNOSIS — Z85118 Personal history of other malignant neoplasm of bronchus and lung: Secondary | ICD-10-CM | POA: Diagnosis not present

## 2021-10-22 DIAGNOSIS — F17210 Nicotine dependence, cigarettes, uncomplicated: Secondary | ICD-10-CM | POA: Insufficient documentation

## 2021-10-22 DIAGNOSIS — Z800 Family history of malignant neoplasm of digestive organs: Secondary | ICD-10-CM | POA: Insufficient documentation

## 2021-10-22 LAB — CBC WITH DIFFERENTIAL/PLATELET
Abs Immature Granulocytes: 0.04 10*3/uL (ref 0.00–0.07)
Basophils Absolute: 0.1 10*3/uL (ref 0.0–0.1)
Basophils Relative: 1 %
Eosinophils Absolute: 0.1 10*3/uL (ref 0.0–0.5)
Eosinophils Relative: 1 %
HCT: 44.6 % (ref 36.0–46.0)
Hemoglobin: 15.2 g/dL — ABNORMAL HIGH (ref 12.0–15.0)
Immature Granulocytes: 0 %
Lymphocytes Relative: 19 %
Lymphs Abs: 2.3 10*3/uL (ref 0.7–4.0)
MCH: 29.1 pg (ref 26.0–34.0)
MCHC: 34.1 g/dL (ref 30.0–36.0)
MCV: 85.3 fL (ref 80.0–100.0)
Monocytes Absolute: 1.2 10*3/uL — ABNORMAL HIGH (ref 0.1–1.0)
Monocytes Relative: 10 %
Neutro Abs: 8.5 10*3/uL — ABNORMAL HIGH (ref 1.7–7.7)
Neutrophils Relative %: 69 %
Platelets: 313 10*3/uL (ref 150–400)
RBC: 5.23 MIL/uL — ABNORMAL HIGH (ref 3.87–5.11)
RDW: 13 % (ref 11.5–15.5)
WBC: 12.2 10*3/uL — ABNORMAL HIGH (ref 4.0–10.5)
nRBC: 0 % (ref 0.0–0.2)

## 2021-10-22 LAB — COMPREHENSIVE METABOLIC PANEL
ALT: 21 U/L (ref 0–44)
AST: 28 U/L (ref 15–41)
Albumin: 4.1 g/dL (ref 3.5–5.0)
Alkaline Phosphatase: 73 U/L (ref 38–126)
Anion gap: 10 (ref 5–15)
BUN: 14 mg/dL (ref 8–23)
CO2: 28 mmol/L (ref 22–32)
Calcium: 9.2 mg/dL (ref 8.9–10.3)
Chloride: 100 mmol/L (ref 98–111)
Creatinine, Ser: 0.62 mg/dL (ref 0.44–1.00)
GFR, Estimated: >60 mL/min (ref >60)
Glucose, Bld: 101 mg/dL — ABNORMAL HIGH (ref 70–99)
Potassium: 3.8 mmol/L (ref 3.5–5.1)
Sodium: 138 mmol/L (ref 135–145)
Total Bilirubin: 0.5 mg/dL (ref 0.3–1.2)
Total Protein: 7.4 g/dL (ref 6.5–8.1)

## 2021-10-22 LAB — LACTATE DEHYDROGENASE: LDH: 148 U/L (ref 98–192)

## 2021-10-24 ENCOUNTER — Encounter: Payer: Self-pay | Admitting: Oncology

## 2021-10-24 ENCOUNTER — Inpatient Hospital Stay: Payer: BLUE CROSS/BLUE SHIELD | Admitting: Oncology

## 2021-10-24 DIAGNOSIS — D72829 Elevated white blood cell count, unspecified: Secondary | ICD-10-CM | POA: Diagnosis not present

## 2021-10-24 DIAGNOSIS — D72828 Other elevated white blood cell count: Secondary | ICD-10-CM

## 2021-10-24 DIAGNOSIS — Z85118 Personal history of other malignant neoplasm of bronchus and lung: Secondary | ICD-10-CM | POA: Diagnosis not present

## 2021-10-24 DIAGNOSIS — Z720 Tobacco use: Secondary | ICD-10-CM | POA: Diagnosis not present

## 2021-10-24 DIAGNOSIS — I771 Stricture of artery: Secondary | ICD-10-CM | POA: Insufficient documentation

## 2021-10-24 DIAGNOSIS — D751 Secondary polycythemia: Secondary | ICD-10-CM

## 2021-10-24 NOTE — Assessment & Plan Note (Signed)
Recommend smoke cessation.  

## 2021-10-24 NOTE — Progress Notes (Signed)
Hematology/Oncology Progress note [phone] [phone]      Patient Care Team: Gladstone Lighter, MD as PCP - General (Internal Medicine)  REFERRING PROVIDER: Gladstone Lighter, MD  CHIEF COMPLAINTS/REASON FOR VISIT:  history of lung cancer  HISTORY OF PRESENTING ILLNESS:   Jamie Harmon is a  61 y.o.  female with PMH listed below was seen in consultation at the request of  Gladstone Lighter, MD  for evaluation of history of lung cancer. Patient moved from [place] to [place] to stay closer to her son and grandkids living alone. Patient is current everyday smoker, 10 cigarettes/day.  She used to smoke 2 packs a day for at least 40 years. She had a history of Lung cancer status post right upper lobectomy.  She denies receiving any chemotherapy or radiation for the lung cancer. She has chronic cough with clear phlegm. She has been having surveillance CT done annually which was interrupted due to pandemic.  Family history she reports brother was diagnosed with esophageal cancer, another brother with GI cancer, mother with a history of gastric cancer.  Father has lymphoma.  Patient had surveillance CT scan done on 10/12/2019, CT showed status post right upper lobectomy-2016 no evidence of recurrent or metastatic disease in the chest. Patient has innumerable tiny centrilobular pulmonary nodules, nonspecific likely due to smoking-related respiratory bronchiolitis.  Emphysema.  Coronary artery disease.  Aortic atherosclerosis.  Hepatic steatosis.  Patient reports that recently she has had experienced more shortness of breath.  Denies any unintentional weight loss, fever, night sweats.Denies any hemoptysis.   INTERVAL HISTORY Jamie Harmon is a 61 y.o. female who has above history reviewed by me today presents for follow up visit for management of history of lung cancer She reports feeling well. No new complaints.  Appetite is fair.  Denies weight loss, fever, chills, fatigue, night sweats, hemoptysis or SOB.   She had a CT done  She continues to smoke daily, half a pack per day.  She used to smoke 1 pack a day since age of 67 until 2016, 64 pack year history.   Review of Systems  Constitutional:  Negative for appetite change, chills, fatigue and fever.  HENT:   Negative for hearing loss and voice change.   Eyes:  Negative for eye problems.  Respiratory:  Positive for shortness of breath. Negative for chest tightness and cough.   Cardiovascular:  Negative for chest pain.  Gastrointestinal:  Negative for abdominal distention, abdominal pain and blood in stool.  Endocrine: Negative for hot flashes.  Genitourinary:  Negative for difficulty urinating and frequency.   Musculoskeletal:  Negative for arthralgias.  Skin:  Negative for itching and rash.  Neurological:  Negative for extremity weakness.  Hematological:  Negative for adenopathy.  Psychiatric/Behavioral:  Negative for confusion.     MEDICAL HISTORY:  Past Medical History:  Diagnosis Date   COPD (chronic obstructive pulmonary disease) ([place])    Hypertension    Tachycardia    Thyroid disease     SURGICAL HISTORY: Past Surgical History:  Procedure Laterality Date   LEFT HEART CATH AND CORONARY ANGIOGRAPHY N/A 06/07/2021   Procedure: LEFT HEART CATH AND CORONARY ANGIOGRAPHY;  Surgeon: Andrez Grime, MD;  Location: [facility];  Service: Cardiovascular;  Laterality: N/A;   LOBECTOMY  10/2014    SOCIAL HISTORY: Social History   Socioeconomic History   Marital status: Married    Spouse name: Not on file   Number of children: Not on file  Years of education: Not on file   Highest education level: Not on file  Occupational History   Occupation: not working   Tobacco Use   Smoking status: Every Day    Packs/day: 0.50    Years: 45.00    Total pack years: 22.50    Types: Cigarettes   Smokeless tobacco: Never  Vaping Use   Vaping Use:  Never used  Substance and Sexual Activity   Alcohol use: Yes    Comment: occasional wine    Drug use: Never   Sexual activity: Not on file  Other Topics Concern   Not on file  Social History Narrative   Not on file   Social Determinants of Health   Financial Resource Strain: Not on file  Food Insecurity: Not on file  Transportation Needs: Not on file  Physical Activity: Not on file  Stress: Not on file  Social Connections: Not on file  Intimate Partner Violence: Not on file    FAMILY HISTORY: Family History  Problem Relation Age of Onset   Stomach cancer Mother    Hypertension Father    Lymphoma Father    Diabetes Sister    Blindness Sister    Hypertension Sister    Colon cancer Brother    Esophageal cancer Brother    Hypertension Brother    Diabetes Brother    Breast cancer Neg Hx     ALLERGIES:  has No Known Allergies.  MEDICATIONS:  Current Outpatient Medications  Medication Sig Dispense Refill   aspirin 81 MG chewable tablet Chew 81 mg by mouth daily.     BREZTRI AEROSPHERE 160-9-4.8 MCG/ACT AERO Inhale 2 puffs into the lungs 2 (two) times daily as needed (SOB).     chlorthalidone (HYGROTON) 25 MG tablet Take 25 mg by mouth daily.     levothyroxine (SYNTHROID) 137 MCG tablet Take 137 mcg by mouth daily before breakfast.     montelukast (SINGULAIR) 10 MG tablet Take 10 mg by mouth at bedtime.     potassium chloride (KLOR-CON) 10 MEQ tablet Take 10 mEq by mouth daily.     rosuvastatin (CRESTOR) 40 MG tablet Take 40 mg by mouth daily.     verapamil (VERELAN PM) 240 MG 24 hr capsule Take 1 capsule by mouth at bedtime.     No current facility-administered medications for this visit.     PHYSICAL EXAMINATION: ECOG PERFORMANCE STATUS: 1 - Symptomatic but completely ambulatory Vitals:   10/24/21 1001  BP: 112/76  Pulse: 76  Resp: 18  Temp: 98.3 F (36.8 C)   Filed Weights   10/24/21 1001  Weight: 126 lb 11.2 oz (57.5 kg)    Physical  Exam Constitutional:      General: She is not in acute distress. HENT:     Head: Normocephalic and atraumatic.  Eyes:     General: No scleral icterus. Cardiovascular:     Rate and Rhythm: Normal rate and regular rhythm.     Heart sounds: Normal heart sounds.  Pulmonary:     Effort: Pulmonary effort is normal. No respiratory distress.     Breath sounds: No wheezing.     Comments: Decreased breath sounds bilaterally Abdominal:     General: Bowel sounds are normal. There is no distension.     Palpations: Abdomen is soft.  Musculoskeletal:        General: No deformity. Normal range of motion.     Cervical back: Normal range of motion and neck supple.  Skin:  General: Skin is warm and dry.     Findings: No erythema or rash.  Neurological:     Mental Status: She is alert and oriented to person, place, and time. Mental status is at baseline.     Cranial Nerves: No cranial nerve deficit.     Coordination: Coordination normal.  Psychiatric:        Mood and Affect: Mood normal.     LABORATORY DATA:  I have reviewed the data as listed Lab Results  Component Value Date   WBC 12.2 (H) 10/22/2021   HGB 15.2 (H) 10/22/2021   HCT 44.6 10/22/2021   MCV 85.3 10/22/2021   PLT 313 10/22/2021   Recent Labs    10/17/21 1003 10/22/21 0951  NA  --  138  K  --  3.8  CL  --  100  CO2  --  28  GLUCOSE  --  101*  BUN  --  14  CREATININE 0.50 0.62  CALCIUM  --  9.2  GFRNONAA  --  >60  PROT  --  7.4  ALBUMIN  --  4.1  AST  --  28  ALT  --  21  ALKPHOS  --  73  BILITOT  --  0.5    Iron/TIBC/Ferritin/ %Sat No results found for: "IRON", "TIBC", "FERRITIN", "IRONPCTSAT"    RADIOGRAPHIC STUDIES: I have personally reviewed the radiological images as listed and agreed with the findings in the report. CT Chest W Contrast  Result Date: 10/18/2021 CLINICAL DATA:  Lung cancer restaging * Tracking Code: BO * EXAM: CT CHEST WITH CONTRAST TECHNIQUE: Multidetector CT imaging of the chest  was performed during intravenous contrast administration. RADIATION DOSE REDUCTION: This exam was performed according to the departmental dose-optimization program which includes automated exposure control, adjustment of the mA and/or kV according to patient size and/or use of iterative reconstruction technique. CONTRAST:  39mL OMNIPAQUE IOHEXOL 300 MG/ML  SOLN COMPARISON:  10/12/2020 FINDINGS: Cardiovascular: Coronary, aortic arch, and branch vessel atherosclerotic vascular disease. Today's exam was not performed as a CT angiogram, there appears to be severe stenosis at the proximal right subclavian artery on images 23 through 28 of series 2, as noted on the prior exam. Mediastinum/Nodes: Small mediastinal lymph nodes are not pathologically enlarged. Lungs/Pleura: Right upper lobectomy. No suspicious pulmonary nodule. Upper Abdomen: Unremarkable Musculoskeletal: Thoracic spondylosis. IMPRESSION: 1. No findings of recurrent malignancy. Prior right upper lobectomy. 2. Aortic Atherosclerosis (ICD10-I70.0). Again noted is likely severe stenosis in the proximal right subclavian artery. Electronically Signed   By: Van Clines M.D.   On: 10/18/2021 15:30      ASSESSMENT & PLAN:   History of lung cancer CT 10/18/21 CT was reviewed and discussed with patient. No signs of cancer recurrence.  Chronic cough likely secondary to tobacco use.  Smoking cessation was discussed. She has been in remission for 7 years. I recommend patient to continue annual CT scan due to her extensive smoking history no history of lung cancer.  I discussed about the option of continue follow-up with oncology annually or follow-up with primary care provider.  Patient opts to follow-up with primary and she may reestablish care with oncology if clinically indicated.   Tobacco use Recommend smoke cessation.   Subclavian artery stenosis (HCC) Dense calcification at the origin of right subclavian artery which may indicate significant  stenosis.  Recommend patient to further discuss with primary care doctor and cardiology.  Continue aspirin and statin  Leukocytosis Likely due to smoking.  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   Discharge   All questions were answered. The patient knows to call the clinic with any problems questions or concerns.  cc Gladstone Lighter, MD   Earlie Server, MD, PhD [facility] Hematology Oncology 10/24/2021

## 2021-10-24 NOTE — Assessment & Plan Note (Signed)
This is new, likely secondary erythrocytosis.

## 2021-10-24 NOTE — Assessment & Plan Note (Addendum)
CT 10/18/21 CT was reviewed and discussed with patient. No signs of cancer recurrence.  Chronic cough likely secondary to tobacco use.  Smoking cessation was discussed. She has been in remission for 7 years. I recommend patient to continue annual CT scan due to her extensive smoking history no history of lung cancer.  I discussed about the option of continue follow-up with oncology annually or follow-up with primary care provider.  Patient opts to follow-up with primary and she may reestablish care with oncology if clinically indicated.

## 2021-10-24 NOTE — Assessment & Plan Note (Signed)
Likely due to smoking.

## 2021-10-24 NOTE — Progress Notes (Signed)
Pt here for follow up. No new concerns voiced.   

## 2021-10-24 NOTE — Assessment & Plan Note (Signed)
Dense calcification at the origin of right subclavian artery which may indicate significant stenosis.  Recommend patient to further discuss with primary care doctor and cardiology.  Continue aspirin and statin

## 2023-03-19 ENCOUNTER — Other Ambulatory Visit: Payer: Self-pay | Admitting: Internal Medicine

## 2023-03-19 DIAGNOSIS — Z1231 Encounter for screening mammogram for malignant neoplasm of breast: Secondary | ICD-10-CM

## 2023-03-20 ENCOUNTER — Ambulatory Visit
Admission: RE | Admit: 2023-03-20 | Discharge: 2023-03-20 | Disposition: A | Discharge status: Home / Self Care | Payer: BLUE CROSS/BLUE SHIELD | Source: Ambulatory Visit | Attending: Internal Medicine | Admitting: Internal Medicine

## 2023-03-20 DIAGNOSIS — Z1231 Encounter for screening mammogram for malignant neoplasm of breast: Secondary | ICD-10-CM | POA: Insufficient documentation

## 2023-03-25 ENCOUNTER — Other Ambulatory Visit: Payer: Self-pay | Admitting: Internal Medicine

## 2023-03-25 DIAGNOSIS — Z902 Acquired absence of lung [part of]: Secondary | ICD-10-CM

## 2023-03-25 DIAGNOSIS — Z0000 Encounter for general adult medical examination without abnormal findings: Secondary | ICD-10-CM

## 2023-04-21 ENCOUNTER — Ambulatory Visit
Admission: RE | Admit: 2023-04-21 | Discharge: 2023-04-21 | Disposition: A | Discharge status: Home / Self Care | Payer: BLUE CROSS/BLUE SHIELD | Source: Ambulatory Visit | Attending: Internal Medicine | Admitting: Internal Medicine

## 2023-04-21 DIAGNOSIS — Z902 Acquired absence of lung [part of]: Secondary | ICD-10-CM | POA: Insufficient documentation

## 2023-04-21 DIAGNOSIS — Z0000 Encounter for general adult medical examination without abnormal findings: Secondary | ICD-10-CM | POA: Diagnosis present

## 2023-04-21 MED ORDER — IOHEXOL 300 MG/ML  SOLN
75.0000 mL | Freq: Once | INTRAMUSCULAR | Status: AC | PRN
  Administered 2023-04-21: 75 mL via INTRAVENOUS

## 2023-08-15 IMAGING — CT CT CHEST W/ CM
2 of 4 series · 15 of 36 positions shown, 18 images · IV contrast (agent unspecified)
Comparison: 10/12/2020

CLINICAL DATA: Lung cancer restaging

* Tracking Code: BO *
EXAM:
CT CHEST WITH CONTRAST
TECHNIQUE: Multidetector CT imaging of the chest was performed during
intravenous contrast administration.

[Series 2: chest 2.00 · axial · 0.57mm/px · z∈[-1178,-926]mm · 12 of 150 slices shown, 15 images]
[im 12/150  mediastinal]
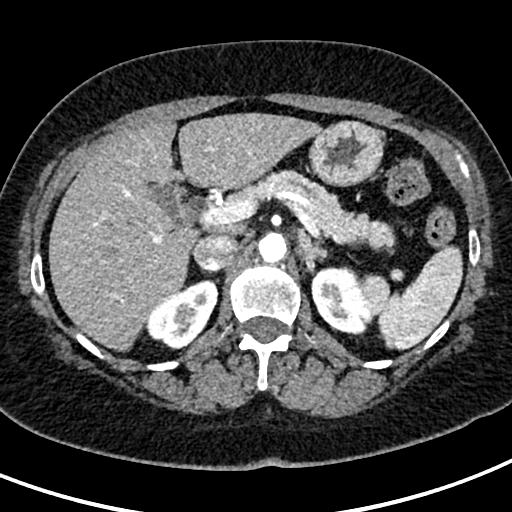
[im 12/150  lung]
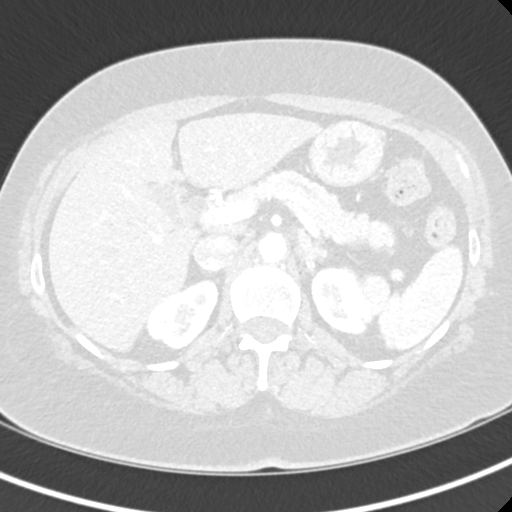
[im 23/150  lung]
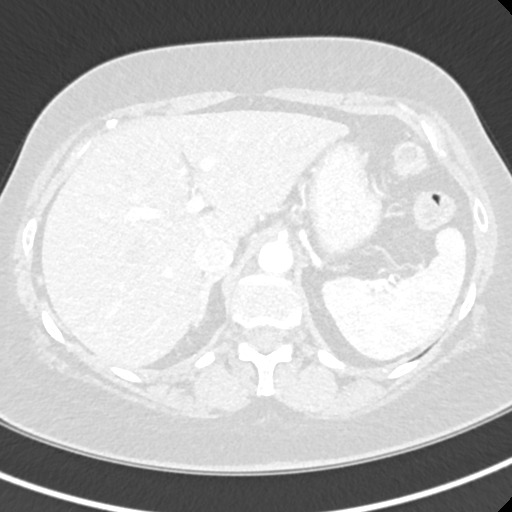
[im 35/150  lung]
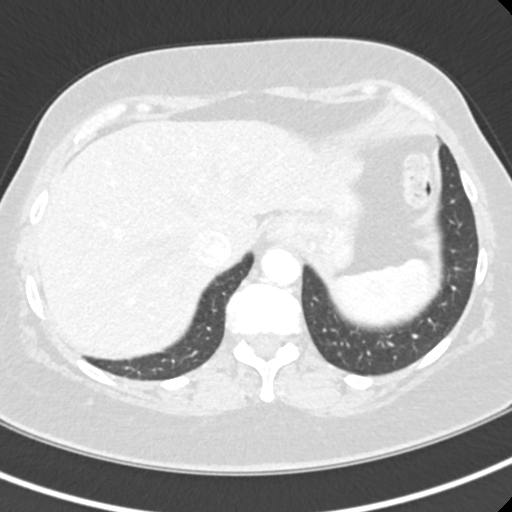
[im 46/150  lung]
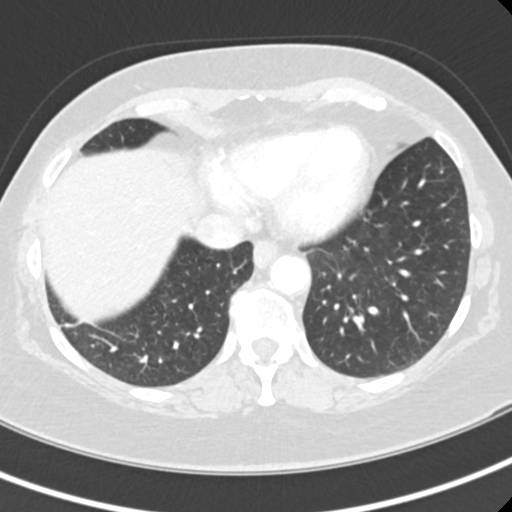
[im 58/150  mediastinal]
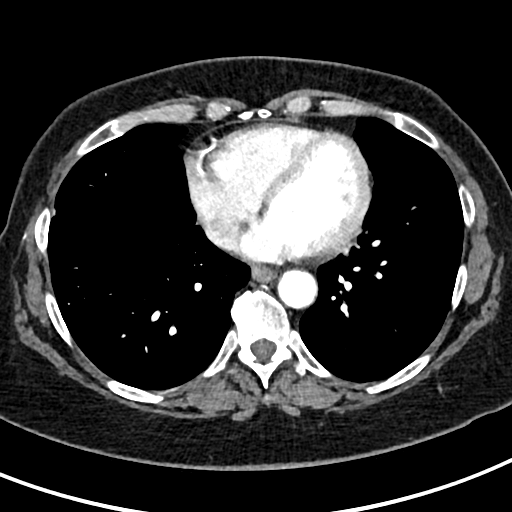
[im 58/150  lung]
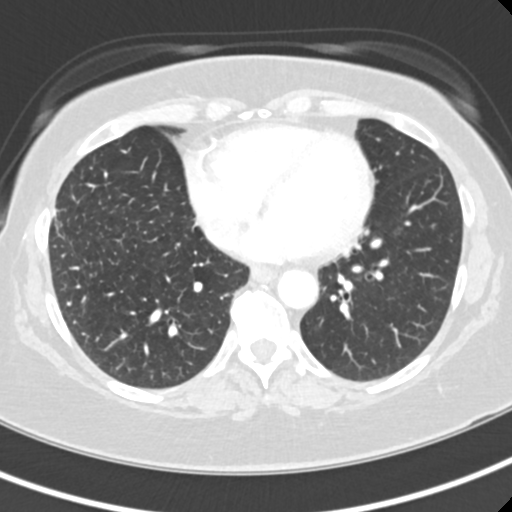
[im 69/150  lung]
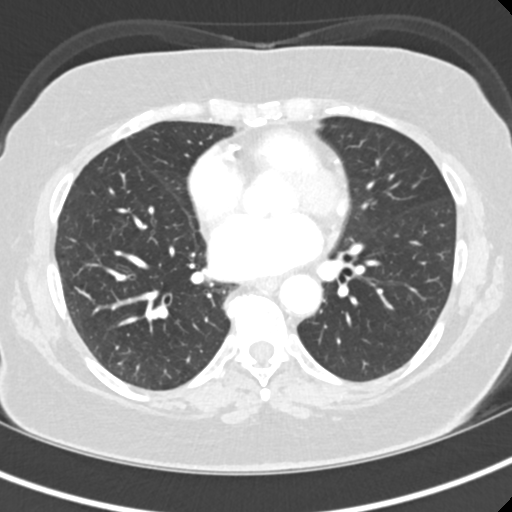
[im 81/150  lung]
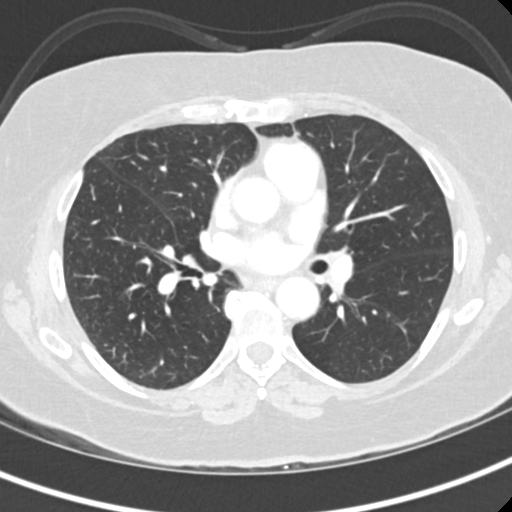
[im 92/150  lung]
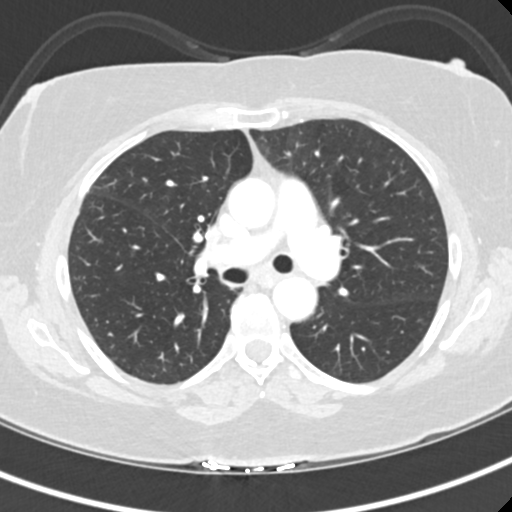
[im 104/150  mediastinal]
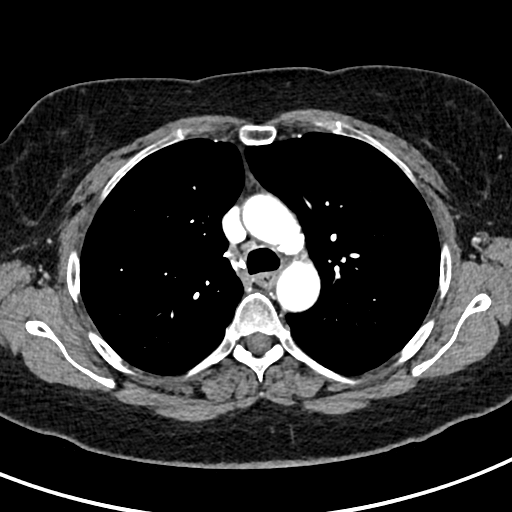
[im 104/150  lung]
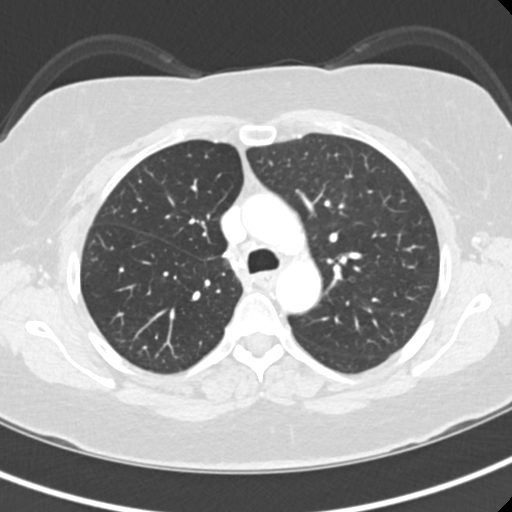
[im 115/150  lung]
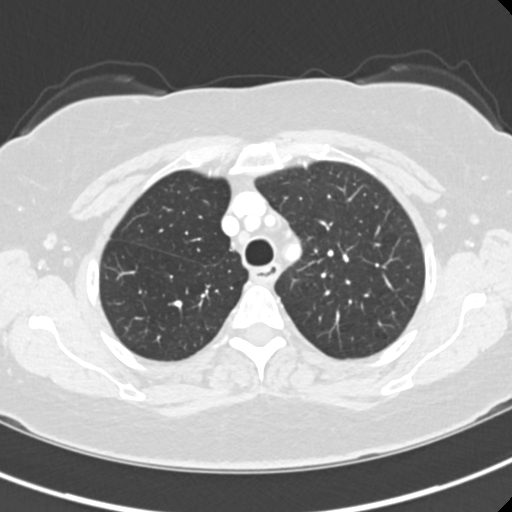
[im 127/150  lung]
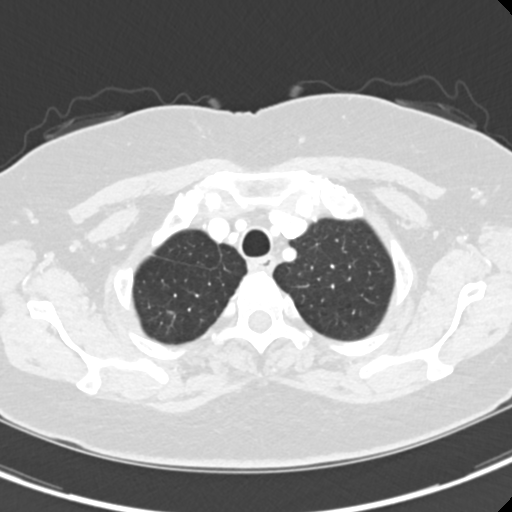
[im 138/150  lung]
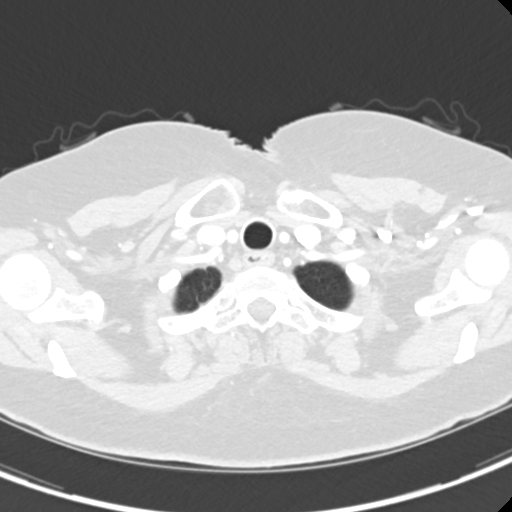

[Series 5: coronals chest 2.00 cor · coronal · 0.57mm/px · 3 of 146 slices shown]
[im 30/146  lung]
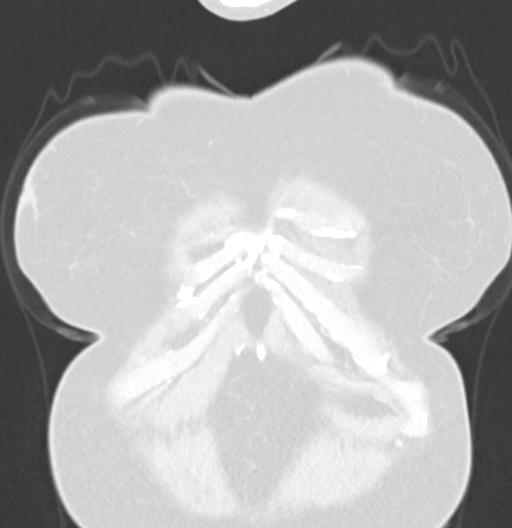
[im 59/146  lung]
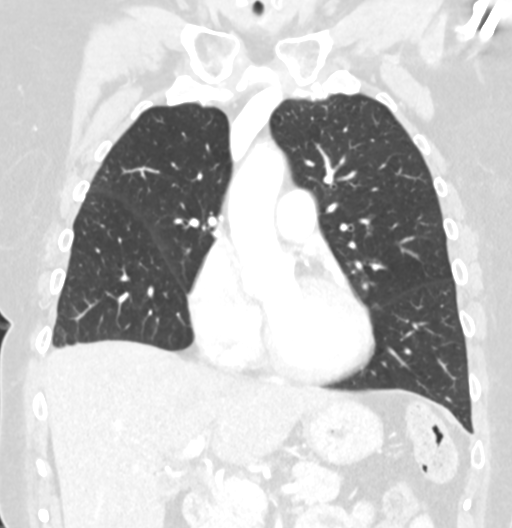
[im 88/146  lung]
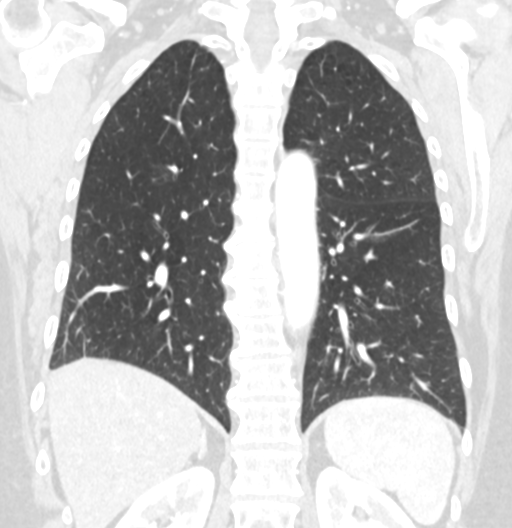

[15 of 36 positions shown; findings below may reference images not displayed]

RADIATION DOSE REDUCTION: This exam was performed according to the
departmental dose-optimization program which includes automated
exposure control, adjustment of the mA and/or kV according to
patient size and/or use of iterative reconstruction technique.

CONTRAST:  75mL OMNIPAQUE IOHEXOL 300 MG/ML  SOLN
FINDINGS: Cardiovascular: Coronary, aortic arch, and branch vessel
atherosclerotic vascular disease. Today's exam was not performed as
a CT angiogram, there appears to be severe stenosis at the proximal
right subclavian artery on images 23 through 28 of series 2, as
noted on the prior exam.

Mediastinum/Nodes: Small mediastinal lymph nodes are not
pathologically enlarged.

Lungs/Pleura: Right upper lobectomy. No suspicious pulmonary nodule.

Upper Abdomen: Unremarkable

Musculoskeletal: Thoracic spondylosis.
IMPRESSION: 1. No findings of recurrent malignancy. Prior right upper lobectomy.
2. Aortic Atherosclerosis ([id number]). Again noted is likely
severe stenosis in the proximal right subclavian artery.

## 2024-02-18 ENCOUNTER — Other Ambulatory Visit: Payer: Self-pay | Admitting: Internal Medicine

## 2024-02-18 DIAGNOSIS — Z1231 Encounter for screening mammogram for malignant neoplasm of breast: Secondary | ICD-10-CM

## 2024-03-23 ENCOUNTER — Ambulatory Visit
Admission: RE | Admit: 2024-03-23 | Discharge: 2024-03-23 | Disposition: A | Discharge status: Home / Self Care | Source: Ambulatory Visit | Attending: Internal Medicine | Admitting: Internal Medicine

## 2024-03-23 DIAGNOSIS — Z1231 Encounter for screening mammogram for malignant neoplasm of breast: Secondary | ICD-10-CM | POA: Diagnosis present

## 2024-04-28 ENCOUNTER — Other Ambulatory Visit: Payer: Self-pay | Admitting: Internal Medicine

## 2024-04-28 DIAGNOSIS — J209 Acute bronchitis, unspecified: Secondary | ICD-10-CM

## 2024-04-28 DIAGNOSIS — Z85118 Personal history of other malignant neoplasm of bronchus and lung: Secondary | ICD-10-CM

## 2024-04-28 DIAGNOSIS — Z0000 Encounter for general adult medical examination without abnormal findings: Secondary | ICD-10-CM

## 2024-04-28 DIAGNOSIS — Z902 Acquired absence of lung [part of]: Secondary | ICD-10-CM

## 2024-05-14 ENCOUNTER — Ambulatory Visit
Admission: RE | Admit: 2024-05-14 | Discharge: 2024-05-14 | Disposition: A | Source: Ambulatory Visit | Attending: Internal Medicine | Admitting: Internal Medicine

## 2024-05-14 DIAGNOSIS — J209 Acute bronchitis, unspecified: Secondary | ICD-10-CM | POA: Diagnosis present

## 2024-05-14 DIAGNOSIS — Z85118 Personal history of other malignant neoplasm of bronchus and lung: Secondary | ICD-10-CM | POA: Diagnosis present

## 2024-05-14 DIAGNOSIS — Z0000 Encounter for general adult medical examination without abnormal findings: Secondary | ICD-10-CM | POA: Insufficient documentation

## 2024-05-14 DIAGNOSIS — Z902 Acquired absence of lung [part of]: Secondary | ICD-10-CM | POA: Insufficient documentation

## 2024-05-14 MED ORDER — IOHEXOL 300 MG/ML  SOLN
75.0000 mL | Freq: Once | INTRAMUSCULAR | Status: AC | PRN
  Administered 2024-05-14: 75 mL via INTRAVENOUS
